# Patient Record
Sex: Female | Born: 1990 | Race: White | Hispanic: No | Marital: Single | Smoking: Never smoker
Health system: Southern US, Community
[De-identification: ages and names within clinical notes are randomized; demographics above are authoritative.]

## PROBLEM LIST (undated history)

## (undated) DIAGNOSIS — J45909 Unspecified asthma, uncomplicated: Secondary | ICD-10-CM

## (undated) DIAGNOSIS — F419 Anxiety disorder, unspecified: Secondary | ICD-10-CM

## (undated) HISTORY — PX: SKIN CANCER EXCISION: SHX779

---
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
## 2012-06-09 ENCOUNTER — Emergency Department (HOSPITAL_COMMUNITY)
Admission: EM | Admit: 2012-06-09 | Discharge: 2012-06-09 | Disposition: A | Discharge status: Home / Self Care | Payer: 59 | Attending: Emergency Medicine | Admitting: Emergency Medicine

## 2012-06-09 ENCOUNTER — Emergency Department (HOSPITAL_COMMUNITY): Payer: 59

## 2012-06-09 ENCOUNTER — Encounter (HOSPITAL_COMMUNITY): Payer: Self-pay

## 2012-06-09 DIAGNOSIS — J45909 Unspecified asthma, uncomplicated: Secondary | ICD-10-CM | POA: Insufficient documentation

## 2012-06-09 DIAGNOSIS — Z79899 Other long term (current) drug therapy: Secondary | ICD-10-CM | POA: Insufficient documentation

## 2012-06-09 DIAGNOSIS — R109 Unspecified abdominal pain: Secondary | ICD-10-CM | POA: Insufficient documentation

## 2012-06-09 DIAGNOSIS — Z791 Long term (current) use of non-steroidal anti-inflammatories (NSAID): Secondary | ICD-10-CM | POA: Insufficient documentation

## 2012-06-09 DIAGNOSIS — F411 Generalized anxiety disorder: Secondary | ICD-10-CM | POA: Insufficient documentation

## 2012-06-09 DIAGNOSIS — Z859 Personal history of malignant neoplasm, unspecified: Secondary | ICD-10-CM | POA: Insufficient documentation

## 2012-06-09 LAB — CBC WITH DIFFERENTIAL/PLATELET
Basophils Absolute: 0 10*3/uL (ref 0.0–0.1)
Basophils Relative: 0 % (ref 0–1)
Eosinophils Absolute: 0.2 10*3/uL (ref 0.0–0.7)
Eosinophils Relative: 3 % (ref 0–5)
HCT: 38.7 % (ref 36.0–46.0)
Hemoglobin: 13.7 g/dL (ref 12.0–15.0)
Lymphocytes Relative: 30 % (ref 12–46)
Lymphs Abs: 2.4 10*3/uL (ref 0.7–4.0)
MCH: 31.1 pg (ref 26.0–34.0)
MCHC: 35.4 g/dL (ref 30.0–36.0)
MCV: 88 fL (ref 78.0–100.0)
Monocytes Absolute: 0.5 10*3/uL (ref 0.1–1.0)
Monocytes Relative: 7 % (ref 3–12)
Neutro Abs: 4.9 10*3/uL (ref 1.7–7.7)
Neutrophils Relative %: 61 % (ref 43–77)
Platelets: 337 10*3/uL (ref 150–400)
RBC: 4.4 MIL/uL (ref 3.87–5.11)
RDW: 12.4 % (ref 11.5–15.5)
WBC: 8.1 10*3/uL (ref 4.0–10.5)

## 2012-06-09 LAB — COMPREHENSIVE METABOLIC PANEL
ALT: 5 U/L (ref 0–35)
AST: 12 U/L (ref 0–37)
Albumin: 3.7 g/dL (ref 3.5–5.2)
Alkaline Phosphatase: 84 U/L (ref 39–117)
BUN: 10 mg/dL (ref 6–23)
CO2: 28 mEq/L (ref 19–32)
Calcium: 9.2 mg/dL (ref 8.4–10.5)
Chloride: 102 mEq/L (ref 96–112)
Creatinine, Ser: 0.81 mg/dL (ref 0.50–1.10)
GFR calc Af Amer: >90 mL/min (ref >90)
GFR calc non Af Amer: >90 mL/min (ref >90)
Glucose, Bld: 101 mg/dL — ABNORMAL HIGH (ref 70–99)
Potassium: 4.2 mEq/L (ref 3.5–5.1)
Sodium: 138 mEq/L (ref 135–145)
Total Bilirubin: 0.6 mg/dL (ref 0.3–1.2)
Total Protein: 6.3 g/dL (ref 6.0–8.3)

## 2012-06-09 LAB — URINALYSIS, ROUTINE W REFLEX MICROSCOPIC
Bilirubin Urine: NEGATIVE
Glucose, UA: NEGATIVE mg/dL
Hgb urine dipstick: NEGATIVE
Leukocytes, UA: NEGATIVE
Nitrite: NEGATIVE
Protein, ur: NEGATIVE mg/dL
Specific Gravity, Urine: 1.027 (ref 1.005–1.030)
Urobilinogen, UA: 1 mg/dL (ref 0.0–1.0)
pH: 5.5 (ref 5.0–8.0)

## 2012-06-09 LAB — POCT PREGNANCY, URINE: Preg Test, Ur: NEGATIVE

## 2012-06-09 MED ORDER — FAMOTIDINE 20 MG PO TABS: 20.0000 mg | ORAL_TABLET | Freq: Two times a day (BID) | ORAL | Status: DC

## 2012-06-09 NOTE — ED Notes (Signed)
Patient c/o right upper abdominal pain, constipation, and nausea x 3 days. Patient reports the pain began 3 months ago and is intermittent. Patient went to [facility] walk-in clinic and was told to come to the ED for further evaluation and treatment.

## 2012-06-09 NOTE — ED Provider Notes (Signed)
History    21 year old female with abdominal pain. Pain is in the epigastric and right upper quadrant. Intermittent for the past several months. Pain radiates sometimes to her back. Onset often shortly after eating. Associated with nausea. No vomiting. No urinary complaints. No fevers or chills. Patient has a past history of asthma, which is otherwise healthy. No history of any abdominal or pelvic surgeries.  CSN: [id number]  Arrival date & time 06/09/12  1020   First MD Initiated Contact with Patient 06/09/12 1116      Chief Complaint  Patient presents with  . Abdominal Pain  . Fever    (Consider location/radiation/quality/duration/timing/severity/associated sxs/prior treatment) HPI  Past Medical History  Diagnosis Date  . Asthma   . Anxiety     Past Surgical History  Procedure Date  . Skin cancer excision     Family History  Problem Relation Age of Onset  . Diabetes Father   . Hyperlipidemia Father   . Hypertension Father   . Asthma Sister     History  Substance Use Topics  . Smoking status: Never Smoker   . Smokeless tobacco: Never Used  . Alcohol Use: No    OB History    Grav Para Term Preterm Abortions TAB SAB Ect Mult Living                  Review of Systems  All systems reviewed and negative, other than as noted in HPI.   Allergies  Review of patient's allergies indicates no known allergies.  Home Medications   Current Outpatient Rx  Name  Route  Sig  Dispense  Refill  . ALBUTEROL SULFATE HFA 108 (90 BASE) MCG/ACT IN AERS   Inhalation   Inhale 2 puffs into the lungs every 6 (six) hours as needed. For shortness of breath.         Marland Kitchen FLUTICASONE-SALMETEROL 100-50 MCG/DOSE IN AEPB   Inhalation   Inhale 1 puff into the lungs every 12 (twelve) hours.         . IBUPROFEN 200 MG PO TABS   Oral   Take 400 mg by mouth every 8 (eight) hours as needed. For pain.         Marland Kitchen NYQUIL PO   Oral   Take 30 mLs by mouth every 6 (six) hours as  needed. For sleep         . TOBRAMYCIN SULFATE 0.3 % OP SOLN   Left Eye   Place 1 drop into the left eye every 8 (eight) hours.         Marland Kitchen FAMOTIDINE 20 MG PO TABS   Oral   Take 1 tablet (20 mg total) by mouth 2 (two) times daily.   60 tablet   0     BP 110/56  Pulse 91  Temp 97.8 F (36.6 C) (Oral)  Resp 20  SpO2 100%  LMP 05/26/2012  Physical Exam  Nursing note and vitals reviewed. Constitutional: She appears well-developed and well-nourished. No distress.       Laying in bed. Nad. Obese.  HENT:  Head: Normocephalic and atraumatic.  Eyes: Conjunctivae normal are normal. Right eye exhibits no discharge. Left eye exhibits no discharge.  Neck: Neck supple.  Cardiovascular: Normal rate, regular rhythm and normal heart sounds.  Exam reveals no gallop and no friction rub.   No murmur heard. Pulmonary/Chest: Effort normal and breath sounds normal. No respiratory distress.  Abdominal: Soft. She exhibits no distension. There is tenderness.  RUQ tenderness w/o rebound or guarding. No distension. No mass palpated.  Musculoskeletal: She exhibits no edema and no tenderness.  Neurological: She is alert.  Skin: Skin is warm and dry. She is not diaphoretic.  Psychiatric: She has a normal mood and affect. Her behavior is normal. Thought content normal.    ED Course  Procedures (including critical care time)  Labs Reviewed  COMPREHENSIVE METABOLIC PANEL - Abnormal; Notable for the following:    Glucose, Bld 101 (*)     All other components within normal limits  URINALYSIS, ROUTINE W REFLEX MICROSCOPIC - Abnormal; Notable for the following:    Color, Urine AMBER (*)  BIOCHEMICALS MAY BE AFFECTED BY COLOR   APPearance CLOUDY (*)     Ketones, ur TRACE (*)     All other components within normal limits  CBC WITH DIFFERENTIAL  POCT PREGNANCY, URINE   US Abdomen Complete  06/09/2012  *RADIOLOGY REPORT*  Clinical Data:  Right upper quadrant abdominal pain.  COMPLETE  ABDOMINAL ULTRASOUND  Comparison:  CT 11/24/2011.  Findings:  Gallbladder:  No gallstones, gallbladder wall thickening, or pericholecystic fluid.  Common bile duct:  4 mm, normal.  Liver:  No focal lesion identified.  Within normal limits in parenchymal echogenicity.  IVC:  Appears normal.  Pancreas:  Suboptimally visualized due to overlying bowel gas.  Spleen:  5.8 cm.  Normal echotexture.  Right Kidney:  11.7 cm. Normal echotexture.  Normal central sinus echo complex.  No calculi or hydronephrosis.  Left Kidney:  10.6 cm. Normal echotexture.  Normal central sinus echo complex.  No calculi or hydronephrosis.  Abdominal aorta:  No aneurysm identified.  IMPRESSION: Negative abdominal ultrasound.   Original Report Authenticated By: Andreas Newport, M.D.      1. Abdominal pain       MDM  21 year old female abdominal pain. Peritoneal. Provided history consistent with biliary colic and subsequent ultrasound negative for a stone. May potentially be biliary dyskinesia. Less likely PUD or reflux. We'll give a trial of H2 blocker though. Recommend following up with PCP to discuss possible GI referral. Emergent return precautions were discussed.        Raeford Razor, MD 06/09/12 [phone]

## 2012-06-09 NOTE — ED Notes (Signed)
[id number]<AV> Expected date:<BR> Expected time:<BR> Means of arrival:<BR> Comments:<BR> Closed

## 2012-06-14 ENCOUNTER — Other Ambulatory Visit (HOSPITAL_COMMUNITY): Payer: Self-pay | Admitting: Family Medicine

## 2012-06-14 DIAGNOSIS — R109 Unspecified abdominal pain: Secondary | ICD-10-CM

## 2012-06-18 ENCOUNTER — Encounter (HOSPITAL_COMMUNITY)
Admission: RE | Admit: 2012-06-18 | Discharge: 2012-06-18 | Disposition: A | Discharge status: Home / Self Care | Payer: 59 | Source: Ambulatory Visit | Attending: Family Medicine | Admitting: Family Medicine

## 2012-06-18 DIAGNOSIS — R1011 Right upper quadrant pain: Secondary | ICD-10-CM | POA: Insufficient documentation

## 2012-06-18 DIAGNOSIS — R109 Unspecified abdominal pain: Secondary | ICD-10-CM

## 2012-06-18 MED ORDER — TECHNETIUM TC 99M MEBROFENIN IV KIT
5.5000 | PACK | Freq: Once | INTRAVENOUS | Status: AC | PRN
  Administered 2012-06-18: 6 via INTRAVENOUS

## 2012-09-19 ENCOUNTER — Other Ambulatory Visit: Payer: Self-pay | Admitting: Family Medicine

## 2012-09-19 DIAGNOSIS — R52 Pain, unspecified: Secondary | ICD-10-CM

## 2012-09-20 ENCOUNTER — Ambulatory Visit
Admission: RE | Admit: 2012-09-20 | Discharge: 2012-09-20 | Disposition: A | Discharge status: Home / Self Care | Payer: 59 | Source: Ambulatory Visit | Attending: Family Medicine | Admitting: Family Medicine

## 2012-09-20 DIAGNOSIS — R52 Pain, unspecified: Secondary | ICD-10-CM

## 2013-02-08 ENCOUNTER — Encounter (HOSPITAL_COMMUNITY): Payer: Self-pay

## 2013-02-08 ENCOUNTER — Emergency Department (HOSPITAL_COMMUNITY)
Admission: EM | Admit: 2013-02-08 | Discharge: 2013-02-08 | Disposition: A | Discharge status: Home / Self Care | Payer: 59 | Attending: Emergency Medicine | Admitting: Emergency Medicine

## 2013-02-08 DIAGNOSIS — Z79899 Other long term (current) drug therapy: Secondary | ICD-10-CM | POA: Insufficient documentation

## 2013-02-08 DIAGNOSIS — Z8659 Personal history of other mental and behavioral disorders: Secondary | ICD-10-CM | POA: Insufficient documentation

## 2013-02-08 DIAGNOSIS — J45909 Unspecified asthma, uncomplicated: Secondary | ICD-10-CM | POA: Insufficient documentation

## 2013-02-08 DIAGNOSIS — R1013 Epigastric pain: Secondary | ICD-10-CM | POA: Insufficient documentation

## 2013-02-08 DIAGNOSIS — Z3202 Encounter for pregnancy test, result negative: Secondary | ICD-10-CM | POA: Insufficient documentation

## 2013-02-08 DIAGNOSIS — R112 Nausea with vomiting, unspecified: Secondary | ICD-10-CM | POA: Insufficient documentation

## 2013-02-08 LAB — COMPREHENSIVE METABOLIC PANEL
ALT: 18 U/L (ref 0–35)
AST: 46 U/L — ABNORMAL HIGH (ref 0–37)
Albumin: 4 g/dL (ref 3.5–5.2)
Alkaline Phosphatase: 78 U/L (ref 39–117)
BUN: 6 mg/dL (ref 6–23)
Chloride: 100 mEq/L (ref 96–112)
Potassium: 3.8 mEq/L (ref 3.5–5.1)
Sodium: 137 mEq/L (ref 135–145)
Total Bilirubin: 0.5 mg/dL (ref 0.3–1.2)

## 2013-02-08 LAB — CBC WITH DIFFERENTIAL/PLATELET
Basophils Relative: 0 % (ref 0–1)
Hemoglobin: 12.6 g/dL (ref 12.0–15.0)
MCHC: 34 g/dL (ref 30.0–36.0)
Monocytes Relative: 6 % (ref 3–12)
Neutro Abs: 6.9 10*3/uL (ref 1.7–7.7)
Neutrophils Relative %: 65 % (ref 43–77)
Platelets: 322 10*3/uL (ref 150–400)
RBC: 4.2 MIL/uL (ref 3.87–5.11)

## 2013-02-08 LAB — RAPID URINE DRUG SCREEN, HOSP PERFORMED
Amphetamines: NOT DETECTED
Barbiturates: NOT DETECTED
Benzodiazepines: NOT DETECTED
Tetrahydrocannabinol: NOT DETECTED

## 2013-02-08 LAB — PREGNANCY, URINE: Preg Test, Ur: NEGATIVE

## 2013-02-08 LAB — ETHANOL: Alcohol, Ethyl (B): <11 mg/dL (ref 0–11)

## 2013-02-08 MED ORDER — GI COCKTAIL ~~LOC~~
30.0000 mL | Freq: Once | ORAL | Status: AC
  Administered 2013-02-08: 30 mL via ORAL
  Filled 2013-02-08: qty 30

## 2013-02-08 MED ORDER — PROMETHAZINE HCL 25 MG PO TABS: 25.0000 mg | ORAL_TABLET | Freq: Four times a day (QID) | ORAL | Status: DC | PRN

## 2013-02-08 MED ORDER — METOCLOPRAMIDE HCL 5 MG/ML IJ SOLN
10.0000 mg | Freq: Once | INTRAMUSCULAR | Status: AC
  Administered 2013-02-08: 10 mg via INTRAVENOUS
  Filled 2013-02-08: qty 2

## 2013-02-08 MED ORDER — ONDANSETRON HCL 4 MG/2ML IJ SOLN
4.0000 mg | Freq: Once | INTRAMUSCULAR | Status: AC
  Administered 2013-02-08: 4 mg via INTRAVENOUS
  Filled 2013-02-08: qty 2

## 2013-02-08 MED ORDER — SODIUM CHLORIDE 0.9 % IV BOLUS (SEPSIS)
1000.0000 mL | Freq: Once | INTRAVENOUS | Status: AC
  Administered 2013-02-08: 1000 mL via INTRAVENOUS

## 2013-02-08 MED ORDER — PANTOPRAZOLE SODIUM 40 MG IV SOLR: 40.0000 mg | Freq: Once | INTRAVENOUS | Status: DC

## 2013-02-08 NOTE — ED Provider Notes (Signed)
CSN: [id number]     Arrival date & time 02/08/13  1754 History     First MD Initiated Contact with Patient 02/08/13 1802     Chief Complaint  Patient presents with  . Hematemesis   (Consider location/radiation/quality/duration/timing/severity/associated sxs/prior Treatment) HPI  Norma Cuevas is a 22 y.o.female with a significant PMH of asthma and anxiety presents to the ER with complaints of hematemesis that started today. She had one episode of vomiting yesterday. Today she had 2 episodes of normal vomiting and then afterwards had two episodes of what she describes as frank bloody vomitus. She described it has being a half cup. Then her third vomit was dark vomit. She is also complaining of epigastric pain. She denies having a history of abdominal pain. Nor a history of GERD, abdominal surgery, alcohol abuse, hx of vomiting blood. She does not appear to be in distress or pain at this time. No vomiting during my interview.   Past Medical History  Diagnosis Date  . Asthma   . Anxiety    Past Surgical History  Procedure Laterality Date  . Skin cancer excision     Family History  Problem Relation Age of Onset  . Diabetes Father   . Hyperlipidemia Father   . Hypertension Father   . Asthma Sister    History  Substance Use Topics  . Smoking status: Never Smoker   . Smokeless tobacco: Never Used  . Alcohol Use: No   OB History   Grav Para Term Preterm Abortions TAB SAB Ect Mult Living                 Review of Systems ROS is negative unless otherwise stated in the HPI  Allergies  Review of patient's allergies indicates no known allergies.  Home Medications   Current Outpatient Rx  Name  Route  Sig  Dispense  Refill  . albuterol (PROVENTIL HFA;VENTOLIN HFA) 108 (90 BASE) MCG/ACT inhaler   Inhalation   Inhale 2 puffs into the lungs every 6 (six) hours as needed. For shortness of breath.         . ARIPiprazole (ABILIFY) 5 MG tablet   Oral   Take 5 mg by mouth  daily.         . Fluticasone-Salmeterol (ADVAIR) 100-50 MCG/DOSE AEPB   Inhalation   Inhale 1 puff into the lungs every 12 (twelve) hours.          BP 143/85  Pulse 74  Temp(Src) 98.8 F (37.1 C) (Oral)  Resp 16  SpO2 100%  LMP 12/04/2012 Physical Exam  Nursing note and vitals reviewed. Constitutional: She appears well-developed and well-nourished. No distress.  HENT:  Head: Normocephalic and atraumatic.   HEENT: Anicteric.  No pallor.  No discharge from ears, eyes, nose, or mouth.   Eyes: Pupils are equal, round, and reactive to light.  Neck: Normal range of motion. Neck supple.  Cardiovascular: Normal rate and regular rhythm.   Pulmonary/Chest: Effort normal.  Abdominal: Soft. There is tenderness in the epigastric area. There is no rigidity, no rebound, no guarding, no CVA tenderness, no tenderness at McBurney's point and negative Murphy's sign.    Neurological: She is alert.  Skin: Skin is warm and dry.    ED Course   Procedures (including critical care time)  Labs Reviewed  CBC WITH DIFFERENTIAL - Abnormal; Notable for the following:    WBC 10.7 (*)    All other components within normal limits  COMPREHENSIVE METABOLIC PANEL - Abnormal;  Notable for the following:    AST 46 (*)    All other components within normal limits  ETHANOL  URINE RAPID DRUG SCREEN (HOSP PERFORMED)  OCCULT BLOOD GASTRIC / DUODENUM (SPECIMEN CUP)  PREGNANCY, URINE   No results found. 1. Nausea and vomiting in adult     MDM  Dr. Romeo Apple has seen patient as well. Throughout patients stay she had no episodes of vomiting so far after 3 hours. Her labs show a slightly elevated white blood cell count but not other abnormalities.  I gave her GI cocktail and Reglan for pain and nausea symptoms which significantly helped her nausea and discomfort. No clinical or laboratory signs of dehydration but given 1 L NS.  Patient feeling much better. Will give referral to GI and nausea medications  for home. Pt comfortable with plan.  22 y.o.Norma Cuevas's evaluation in the Emergency Department is complete. It has been determined that no acute conditions requiring further emergency intervention are present at this time. The patient/guardian have been advised of the diagnosis and plan. We have discussed signs and symptoms that warrant return to the ED, such as changes or worsening in symptoms.  Vital signs are stable at discharge. Filed Vitals:   02/08/13 1809  BP: 143/85  Pulse: 74  Temp: 98.8 F (37.1 C)  Resp: 16    Patient/guardian has voiced understanding and agreed to follow-up with the PCP or specialist.   Dorthula Matas, PA-C 02/08/13 2108

## 2013-02-08 NOTE — ED Notes (Signed)
She states she had a few diarrhea stools yesterday, but felt well.  No diarrhea today, but she is here with c/o n/v several times past 3-4 hours, and in the last couple of emeses she has seen a quantity of "dark blood".  She is healthy-looking and in no distress.

## 2013-02-08 NOTE — ED Provider Notes (Signed)
Medical screening examination/treatment/procedure(s) were performed by non-physician practitioner and as supervising physician I was immediately available for consultation/collaboration.   Junius Argyle, MD 02/08/13 [phone]

## 2013-08-18 ENCOUNTER — Emergency Department (HOSPITAL_COMMUNITY)
Admission: EM | Admit: 2013-08-18 | Discharge: 2013-08-19 | Disposition: A | Discharge status: Home / Self Care | Payer: 59 | Attending: Emergency Medicine | Admitting: Emergency Medicine

## 2013-08-18 ENCOUNTER — Encounter (HOSPITAL_COMMUNITY): Payer: Self-pay | Admitting: Emergency Medicine

## 2013-08-18 ENCOUNTER — Emergency Department (HOSPITAL_COMMUNITY): Payer: 59

## 2013-08-18 DIAGNOSIS — Z87448 Personal history of other diseases of urinary system: Secondary | ICD-10-CM | POA: Insufficient documentation

## 2013-08-18 DIAGNOSIS — R51 Headache: Secondary | ICD-10-CM | POA: Insufficient documentation

## 2013-08-18 DIAGNOSIS — R1013 Epigastric pain: Secondary | ICD-10-CM | POA: Insufficient documentation

## 2013-08-18 DIAGNOSIS — R42 Dizziness and giddiness: Secondary | ICD-10-CM | POA: Insufficient documentation

## 2013-08-18 DIAGNOSIS — Z8659 Personal history of other mental and behavioral disorders: Secondary | ICD-10-CM | POA: Insufficient documentation

## 2013-08-18 DIAGNOSIS — Z3202 Encounter for pregnancy test, result negative: Secondary | ICD-10-CM | POA: Insufficient documentation

## 2013-08-18 DIAGNOSIS — R112 Nausea with vomiting, unspecified: Secondary | ICD-10-CM | POA: Insufficient documentation

## 2013-08-18 DIAGNOSIS — J45909 Unspecified asthma, uncomplicated: Secondary | ICD-10-CM | POA: Insufficient documentation

## 2013-08-18 DIAGNOSIS — R300 Dysuria: Secondary | ICD-10-CM | POA: Insufficient documentation

## 2013-08-18 DIAGNOSIS — R3589 Other polyuria: Secondary | ICD-10-CM | POA: Insufficient documentation

## 2013-08-18 DIAGNOSIS — R1031 Right lower quadrant pain: Secondary | ICD-10-CM | POA: Insufficient documentation

## 2013-08-18 DIAGNOSIS — R1030 Lower abdominal pain, unspecified: Secondary | ICD-10-CM

## 2013-08-18 DIAGNOSIS — R358 Other polyuria: Secondary | ICD-10-CM | POA: Insufficient documentation

## 2013-08-18 DIAGNOSIS — Z79899 Other long term (current) drug therapy: Secondary | ICD-10-CM | POA: Insufficient documentation

## 2013-08-18 LAB — COMPREHENSIVE METABOLIC PANEL
ALT: 8 U/L (ref 0–35)
AST: 13 U/L (ref 0–37)
Albumin: 3.8 g/dL (ref 3.5–5.2)
Alkaline Phosphatase: 86 U/L (ref 39–117)
BUN: 10 mg/dL (ref 6–23)
CO2: 27 mEq/L (ref 19–32)
Calcium: 9.1 mg/dL (ref 8.4–10.5)
Chloride: 98 mEq/L (ref 96–112)
Creatinine, Ser: 0.86 mg/dL (ref 0.50–1.10)
GFR calc Af Amer: >90 mL/min (ref >90)
GFR calc non Af Amer: >90 mL/min (ref >90)
Glucose, Bld: 91 mg/dL (ref 70–99)
Potassium: 4.1 mEq/L (ref 3.7–5.3)
Sodium: 136 mEq/L — ABNORMAL LOW (ref 137–147)
Total Bilirubin: 0.3 mg/dL (ref 0.3–1.2)
Total Protein: 6.8 g/dL (ref 6.0–8.3)

## 2013-08-18 LAB — CBC WITH DIFFERENTIAL/PLATELET
Basophils Absolute: 0 10*3/uL (ref 0.0–0.1)
Basophils Relative: 0 % (ref 0–1)
Eosinophils Absolute: 0.2 10*3/uL (ref 0.0–0.7)
Eosinophils Relative: 2 % (ref 0–5)
HCT: 37.9 % (ref 36.0–46.0)
Hemoglobin: 13.4 g/dL (ref 12.0–15.0)
Lymphocytes Relative: 28 % (ref 12–46)
Lymphs Abs: 3.1 10*3/uL (ref 0.7–4.0)
MCH: 31.3 pg (ref 26.0–34.0)
MCHC: 35.4 g/dL (ref 30.0–36.0)
MCV: 88.6 fL (ref 78.0–100.0)
Monocytes Absolute: 0.5 10*3/uL (ref 0.1–1.0)
Monocytes Relative: 4 % (ref 3–12)
Neutro Abs: 7.1 10*3/uL (ref 1.7–7.7)
Neutrophils Relative %: 66 % (ref 43–77)
Platelets: 358 10*3/uL (ref 150–400)
RBC: 4.28 MIL/uL (ref 3.87–5.11)
RDW: 12.3 % (ref 11.5–15.5)
WBC: 10.9 10*3/uL — ABNORMAL HIGH (ref 4.0–10.5)

## 2013-08-18 LAB — WET PREP, GENITAL
CLUE CELLS WET PREP: NONE SEEN
Trich, Wet Prep: NONE SEEN
Yeast Wet Prep HPF POC: NONE SEEN

## 2013-08-18 LAB — URINALYSIS, ROUTINE W REFLEX MICROSCOPIC
Bilirubin Urine: NEGATIVE
Glucose, UA: NEGATIVE mg/dL
Hgb urine dipstick: NEGATIVE
Ketones, ur: NEGATIVE mg/dL
Leukocytes, UA: NEGATIVE
Nitrite: NEGATIVE
Protein, ur: NEGATIVE mg/dL
Specific Gravity, Urine: 1.023 (ref 1.005–1.030)
Urobilinogen, UA: 0.2 mg/dL (ref 0.0–1.0)
pH: 5.5 (ref 5.0–8.0)

## 2013-08-18 LAB — POC URINE PREG, ED: Preg Test, Ur: NEGATIVE

## 2013-08-18 LAB — LIPASE, BLOOD: Lipase: 15 U/L (ref 11–59)

## 2013-08-18 MED ORDER — IOHEXOL 300 MG/ML  SOLN
100.0000 mL | Freq: Once | INTRAMUSCULAR | Status: AC | PRN
  Administered 2013-08-18: 100 mL via INTRAVENOUS

## 2013-08-18 MED ORDER — ONDANSETRON HCL 4 MG/2ML IJ SOLN
4.0000 mg | Freq: Once | INTRAMUSCULAR | Status: AC
  Administered 2013-08-18: 4 mg via INTRAVENOUS
  Filled 2013-08-18: qty 2

## 2013-08-18 MED ORDER — MORPHINE SULFATE 4 MG/ML IJ SOLN
4.0000 mg | Freq: Once | INTRAMUSCULAR | Status: AC
  Administered 2013-08-18: 4 mg via INTRAVENOUS
  Filled 2013-08-18: qty 1

## 2013-08-18 MED ORDER — IOHEXOL 300 MG/ML  SOLN
50.0000 mL | Freq: Once | INTRAMUSCULAR | Status: AC | PRN
  Administered 2013-08-18: 50 mL via ORAL

## 2013-08-18 NOTE — ED Provider Notes (Signed)
CSN: [id number]     Arrival date & time 08/18/13  1830 History   First MD Initiated Contact with Patient 08/18/13 2027     Chief Complaint  Patient presents with  . Flank Pain  . Abdominal Pain     (Consider location/radiation/quality/duration/timing/severity/associated sxs/prior Treatment) HPI Patient is a 23 year old morbidly obese female who presents complaining of bilateral flank pain x 1 week and new onset epigastric pain that began today. The pain on her right flank is worse than on her left and radiates toward her RLQ. Patient states the pain began one week ago and she was treated with ciprofloxacin at an Urgent Care for a bladder infection. She returned to the Urgent Care this morning because the pain had not gone away. Patient states the Urgent Care did not know what was causing the pain which got worse gradually throughout the day so she came here. The patient also notes sharp, intermittent epigastric pain that started today. She has tried ibuprofen to manage the pain but is without relief. LMP was one week ago, before the onset of symptoms. Patient says there is no chance she is pregnant. She has no prior history of abdominal surgery.  Past Medical History  Diagnosis Date  . Asthma   . Anxiety    Past Surgical History  Procedure Laterality Date  . Skin cancer excision     Family History  Problem Relation Age of Onset  . Diabetes Father   . Hyperlipidemia Father   . Hypertension Father   . Asthma Sister    History  Substance Use Topics  . Smoking status: Never Smoker   . Smokeless tobacco: Never Used  . Alcohol Use: No   OB History   Grav Para Term Preterm Abortions TAB SAB Ect Mult Living                 Review of Systems  Constitutional: Negative for fever and chills.  Eyes: Negative.   Respiratory: Negative.  Negative for cough and shortness of breath.   Cardiovascular: Negative for chest pain and palpitations.  Gastrointestinal: Positive for nausea, vomiting  and abdominal pain. Negative for diarrhea, constipation and blood in stool.  Endocrine: Positive for polyuria.  Genitourinary: Positive for dysuria and flank pain. Negative for vaginal discharge.  Musculoskeletal: Negative.   Skin: Negative.   Allergic/Immunologic: Negative.   Neurological: Positive for light-headedness and headaches.  Hematological: Negative.   Psychiatric/Behavioral: Negative.    Patient endorses bilateral band-like headache, lightheadedness, nausea and vomiting. She says she saw bright red blood in her vomit once. Patient has had polyuria and dysuria but has not noticed blood in her urine. Patient denies fever, chills, cough, shortness of breath, current chest pain, palpitations, diarrhea, constipation, change in bowel habits, or irregular vaginal discharge. LMP was one week ago.    Allergies  Review of patient's allergies indicates no known allergies.  Home Medications   Current Outpatient Rx  Name  Route  Sig  Dispense  Refill  . ARIPiprazole 400 MG SUSR   Intramuscular   Inject 400 mg into the muscle once.         . Meth-Hyo-M Bl-Na Phos-Ph Sal (URIBEL PO)   Oral   Take 1 tablet by mouth 4 (four) times daily.         . ValACYclovir HCl (VALTREX PO)   Oral   Take 1 tablet by mouth 2 (two) times daily. For flare-up.         Marland Kitchen. albuterol (  PROVENTIL HFA;VENTOLIN HFA) 108 (90 BASE) MCG/ACT inhaler   Inhalation   Inhale 2 puffs into the lungs every 6 (six) hours as needed. For shortness of breath.         . ciprofloxacin (CIPRO) 500 MG tablet   Oral   Take 500 mg by mouth 2 (two) times daily. For 5 days.          BP 130/67  Pulse 82  Temp(Src) 97.8 F (36.6 C) (Oral)  Resp 16  SpO2 100%  LMP 08/08/2013 Physical Exam  Constitutional: Vital signs are normal. She appears well-developed and well-nourished. She is cooperative. No distress.  Cardiovascular: Normal rate and normal heart sounds.  Exam reveals no gallop and no friction rub.   No  murmur heard. Pulmonary/Chest: Effort normal and breath sounds normal. No respiratory distress. She has no wheezes. She has no rales. She exhibits no tenderness.  Abdominal: Soft. Bowel sounds are normal. There is tenderness in the right lower quadrant and epigastric area. There is guarding, CVA tenderness and tenderness at McBurney's point. Hernia confirmed negative in the right inguinal area and confirmed negative in the left inguinal area.  Genitourinary: Uterus normal. Pelvic exam was performed with patient supine. There is no rash or tenderness on the right labia. There is no rash or tenderness on the left labia. Cervix exhibits no motion tenderness, no discharge and no friability. Right adnexum displays no tenderness. Left adnexum displays no tenderness. No tenderness or bleeding around the vagina. No foreign body around the vagina. No vaginal discharge found.  Lymphadenopathy:       Right: No inguinal adenopathy present.       Left: No inguinal adenopathy present.  Neurological: She is alert.  Skin: Skin is warm and dry. No rash noted. No erythema.    Upon physical exam, patient is in no apparent distress with stable vital signs. No gallops, murmurs, or rubs. Lungs clear to auscultation bilaterally both anteriorly and posteriorly. Right-sided CVA tenderness, mild left-sided CVA tenderness. Movement exacerbates the pain. Postive bowel sounds throughout. Patient is diffusely tender to palpation with positive McBurney's point tenderness and epigastric tenderness. Positive obturator and psoas signs.     ED Course  Procedures (including critical care time)  10:14 PM Patient  presents with right lower quadrant abdominal pain ongoing for a week. She was initially treated for urinary tract infection with Cipro but states it provided no relief. She developed worsening pain which he was seen at urgent care today for the same but was discharge with pain medication only. She is here for further  evaluation. Today her urine shows no evidence of urinary tract infection. Pelvic exam without any significant discomfort. We'll obtain abdominal and pelvis CT scan to rule out appendicitis or colitis.  Pain medication given.    12:15 AM Abdominal and pelvis CT scan shows no acute finding. Her examination, patient has a fairly benign abdomen. She is afebrile with stable normal vital signs. Patient is stable for discharge with close followup with the PCP. Return precautions discussed  labs Review Labs Reviewed  WET PREP, GENITAL - Abnormal; Notable for the following:    WBC, Wet Prep HPF POC RARE (*)    All other components within normal limits  CBC WITH DIFFERENTIAL - Abnormal; Notable for the following:    WBC 10.9 (*)    All other components within normal limits  COMPREHENSIVE METABOLIC PANEL - Abnormal; Notable for the following:    Sodium 136 (*)    All other  components within normal limits  URINALYSIS, ROUTINE W REFLEX MICROSCOPIC - Abnormal; Notable for the following:    Color, Urine GREEN (*)    All other components within normal limits  GC/CHLAMYDIA PROBE AMP  LIPASE, BLOOD  HIV ANTIBODY (ROUTINE TESTING)  POC URINE PREG, ED   Imaging Review Ct Abdomen Pelvis W Contrast  08/18/2013   CLINICAL DATA:  Flank pain.  Rule out appendicitis.  EXAM: CT ABDOMEN AND PELVIS WITH CONTRAST  TECHNIQUE: Multidetector CT imaging of the abdomen and pelvis was performed using the standard protocol following bolus administration of intravenous contrast.  CONTRAST:  OMNIPAQUE IOHEXOL 300 MG/ML  SOLN  COMPARISON:  11/24/2011  FINDINGS: BODY WALL: Unremarkable.  LOWER CHEST: Few locules of gas in the right ventricle.  ABDOMEN/PELVIS:  Liver: No focal abnormality.  Biliary: No evidence of biliary obstruction or stone.  Pancreas: Unremarkable.  Spleen: Unremarkable.  Adrenals: Unremarkable.  Kidneys and ureters: No hydronephrosis or stone.  Bladder: Unremarkable.  Reproductive: Unremarkable.  Bowel: No  obstruction. Normal appendix.  Retroperitoneum: No mass or adenopathy.  Peritoneum: No free fluid or gas.  Vascular: Mild right common iliac artery atherosclerotic calcification.  OSSEOUS: Disc herniations at L3-4, L4-5, and L5-S1, which appear unchanged from 2013 comparison. The largest is right paracentral at L5-S1, contacting the S1 nerve root within the lateral recess.  IMPRESSION: 1. No acute intra-abdominal abnormality, including appendicitis. 2. Lower lumbar degenerative disc disease, without appreciable change from 2013.   Electronically Signed   By: Tiburcio Pea M.D.   On: 08/18/2013 23:58     EKG Interpretation None      MDM   Final diagnoses:  Abdominal pain, lower    BP 130/67  Pulse 82  Temp(Src) 97.8 F (36.6 C) (Oral)  Resp 16  SpO2 100%  LMP 08/08/2013  I have reviewed nursing notes and vital signs. I personally reviewed the imaging tests through PACS system  I reviewed available ER/hospitalization records thought the EMR     Fayrene Helper, [place] 08/19/13 0016

## 2013-08-18 NOTE — ED Notes (Signed)
Per Pt report: pt c/o bilateral flank pain but with the pain on the right side radiating to the from of her lower abd.  Pt also c/o of sharp pain in her epigastric area.  Pt reports pain began about a week ago and it has progressively became worse.  Pt seen at urgent care at last Wednesday and also this morning.  Pt was treated for a bladder infection last week.  Pt prescribed norco today but has not had a chance to get it filled.  Pt denies N/V, SOB, fever or chills. Pt reports pain and frequency upon voiding.  Pt a/o x 4. Skin warm and dry.  Ambulatory in triage.

## 2013-08-19 LAB — HIV ANTIBODY (ROUTINE TESTING W REFLEX): HIV: NONREACTIVE

## 2013-08-19 LAB — GC/CHLAMYDIA PROBE AMP
CT PROBE, AMP APTIMA: NEGATIVE
GC Probe RNA: NEGATIVE

## 2013-08-19 NOTE — ED Provider Notes (Signed)
Medical screening examination/treatment/procedure(s) were performed by non-physician practitioner and as supervising physician I was immediately available for consultation/collaboration.   EKG Interpretation None       Fia Hebert, MD 08/19/13 1458 

## 2013-08-19 NOTE — Discharge Instructions (Signed)
Abdominal Pain, Women °Abdominal (stomach, pelvic, or belly) pain can be caused by many things. It is important to tell your doctor: °· The location of the pain. °· Does it come and go or is it present all the time? °· Are there things that start the pain (eating certain foods, exercise)? °· Are there other symptoms associated with the pain (fever, nausea, vomiting, diarrhea)? °All of this is helpful to know when trying to find the cause of the pain. °CAUSES  °· Stomach: virus or bacteria infection, or ulcer. °· Intestine: appendicitis (inflamed appendix), regional ileitis (Crohn's disease), ulcerative colitis (inflamed colon), irritable bowel syndrome, diverticulitis (inflamed diverticulum of the colon), or cancer of the stomach or intestine. °· Gallbladder disease or stones in the gallbladder. °· Kidney disease, kidney stones, or infection. °· Pancreas infection or cancer. °· Fibromyalgia (pain disorder). °· Diseases of the female organs: °· Uterus: fibroid (non-cancerous) tumors or infection. °· Fallopian tubes: infection or tubal pregnancy. °· Ovary: cysts or tumors. °· Pelvic adhesions (scar tissue). °· Endometriosis (uterus lining tissue growing in the pelvis and on the pelvic organs). °· Pelvic congestion syndrome (female organs filling up with blood just before the menstrual period). °· Pain with the menstrual period. °· Pain with ovulation (producing an egg). °· Pain with an IUD (intrauterine device, birth control) in the uterus. °· Cancer of the female organs. °· Functional pain (pain not caused by a disease, may improve without treatment). °· Psychological pain. °· Depression. °DIAGNOSIS  °Your doctor will decide the seriousness of your pain by doing an examination. °· Blood tests. °· X-rays. °· Ultrasound. °· CT scan (computed tomography, special type of X-ray). °· MRI (magnetic resonance imaging). °· Cultures, for infection. °· Barium enema (dye inserted in the large intestine, to better view it with  X-rays). °· Colonoscopy (looking in intestine with a lighted tube). °· Laparoscopy (minor surgery, looking in abdomen with a lighted tube). °· Major abdominal exploratory surgery (looking in abdomen with a large incision). °TREATMENT  °The treatment will depend on the cause of the pain.  °· Many cases can be observed and treated at home. °· Over-the-counter medicines recommended by your caregiver. °· Prescription medicine. °· Antibiotics, for infection. °· Birth control pills, for painful periods or for ovulation pain. °· Hormone treatment, for endometriosis. °· Nerve blocking injections. °· Physical therapy. °· Antidepressants. °· Counseling with a psychologist or psychiatrist. °· Minor or major surgery. °HOME CARE INSTRUCTIONS  °· Do not take laxatives, unless directed by your caregiver. °· Take over-the-counter pain medicine only if ordered by your caregiver. Do not take aspirin because it can cause an upset stomach or bleeding. °· Try a clear liquid diet (broth or water) as ordered by your caregiver. Slowly move to a bland diet, as tolerated, if the pain is related to the stomach or intestine. °· Have a thermometer and take your temperature several times a day, and record it. °· Bed rest and sleep, if it helps the pain. °· Avoid sexual intercourse, if it causes pain. °· Avoid stressful situations. °· Keep your follow-up appointments and tests, as your caregiver orders. °· If the pain does not go away with medicine or surgery, you may try: °· Acupuncture. °· Relaxation exercises (yoga, meditation). °· Group therapy. °· Counseling. °SEEK MEDICAL CARE IF:  °· You notice certain foods cause stomach pain. °· Your home care treatment is not helping your pain. °· You need stronger pain medicine. °· You want your IUD removed. °· You feel faint or   lightheaded. °· You develop nausea and vomiting. °· You develop a rash. °· You are having side effects or an allergy to your medicine. °SEEK IMMEDIATE MEDICAL CARE IF:  °· Your  pain does not go away or gets worse. °· You have a fever. °· Your pain is felt only in portions of the abdomen. The right side could possibly be appendicitis. The left lower portion of the abdomen could be colitis or diverticulitis. °· You are passing blood in your stools (bright red or black tarry stools, with or without vomiting). °· You have blood in your urine. °· You develop chills, with or without a fever. °· You pass out. °MAKE SURE YOU:  °· Understand these instructions. °· Will watch your condition. °· Will get help right away if you are not doing well or get worse. °Document Released: 04/02/2007 Document Revised: 08/28/2011 Document Reviewed: 04/22/2009 °[employer]® Patient Information ©2014 [employer]. ° °

## 2013-10-10 ENCOUNTER — Encounter (HOSPITAL_COMMUNITY): Payer: Self-pay | Admitting: Emergency Medicine

## 2013-10-10 ENCOUNTER — Emergency Department (HOSPITAL_COMMUNITY)
Admission: EM | Admit: 2013-10-10 | Discharge: 2013-10-10 | Disposition: A | Discharge status: Home / Self Care | Payer: 59 | Attending: Emergency Medicine | Admitting: Emergency Medicine

## 2013-10-10 ENCOUNTER — Emergency Department (HOSPITAL_COMMUNITY): Payer: 59

## 2013-10-10 DIAGNOSIS — F411 Generalized anxiety disorder: Secondary | ICD-10-CM | POA: Insufficient documentation

## 2013-10-10 DIAGNOSIS — Z792 Long term (current) use of antibiotics: Secondary | ICD-10-CM | POA: Insufficient documentation

## 2013-10-10 DIAGNOSIS — Z79899 Other long term (current) drug therapy: Secondary | ICD-10-CM | POA: Insufficient documentation

## 2013-10-10 DIAGNOSIS — R519 Headache, unspecified: Secondary | ICD-10-CM

## 2013-10-10 DIAGNOSIS — J3489 Other specified disorders of nose and nasal sinuses: Secondary | ICD-10-CM | POA: Insufficient documentation

## 2013-10-10 DIAGNOSIS — Z7982 Long term (current) use of aspirin: Secondary | ICD-10-CM | POA: Insufficient documentation

## 2013-10-10 DIAGNOSIS — R51 Headache: Secondary | ICD-10-CM | POA: Insufficient documentation

## 2013-10-10 DIAGNOSIS — J45909 Unspecified asthma, uncomplicated: Secondary | ICD-10-CM | POA: Insufficient documentation

## 2013-10-10 DIAGNOSIS — R040 Epistaxis: Secondary | ICD-10-CM

## 2013-10-10 DIAGNOSIS — R1110 Vomiting, unspecified: Secondary | ICD-10-CM | POA: Insufficient documentation

## 2013-10-10 MED ORDER — TRAMADOL HCL 50 MG PO TABS: 50.0000 mg | ORAL_TABLET | Freq: Four times a day (QID) | ORAL | Status: AC | PRN

## 2013-10-10 MED ORDER — DIPHENHYDRAMINE HCL 50 MG/ML IJ SOLN
25.0000 mg | Freq: Once | INTRAMUSCULAR | Status: AC
  Administered 2013-10-10: 25 mg via INTRAMUSCULAR
  Filled 2013-10-10: qty 1

## 2013-10-10 MED ORDER — PROCHLORPERAZINE EDISYLATE 5 MG/ML IJ SOLN
10.0000 mg | Freq: Once | INTRAMUSCULAR | Status: AC
  Administered 2013-10-10: 10 mg via INTRAMUSCULAR
  Filled 2013-10-10: qty 2

## 2013-10-10 MED ORDER — KETOROLAC TROMETHAMINE 60 MG/2ML IM SOLN
60.0000 mg | Freq: Once | INTRAMUSCULAR | Status: AC
  Administered 2013-10-10: 60 mg via INTRAMUSCULAR
  Filled 2013-10-10: qty 2

## 2013-10-10 MED ORDER — DEXAMETHASONE SODIUM PHOSPHATE 10 MG/ML IJ SOLN
10.0000 mg | Freq: Once | INTRAMUSCULAR | Status: AC
  Administered 2013-10-10: 10 mg via INTRAMUSCULAR
  Filled 2013-10-10: qty 1

## 2013-10-10 NOTE — ED Notes (Signed)
Patient transported to CT 

## 2013-10-10 NOTE — Discharge Instructions (Signed)
Headaches, Frequently Asked Questions °MIGRAINE HEADACHES °Q: What is migraine? What causes it? How can I treat it? °A: Generally, migraine headaches begin as a dull ache. Then they develop into a constant, throbbing, and pulsating pain. You may experience pain at the temples. You may experience pain at the front or back of one or both sides of the head. The pain is usually accompanied by a combination of: °· Nausea. °· Vomiting. °· Sensitivity to light and noise. °Some people (about 15%) experience an aura (see below) before an attack. The cause of migraine is believed to be chemical reactions in the brain. Treatment for migraine may include over-the-counter or prescription medications. It may also include self-help techniques. These include relaxation training and biofeedback.  °Q: What is an aura? °A: About 15% of people with migraine get an "aura". This is a sign of neurological symptoms that occur before a migraine headache. You may see wavy or jagged lines, dots, or flashing lights. You might experience tunnel vision or blind spots in one or both eyes. The aura can include visual or auditory hallucinations (something imagined). It may include disruptions in smell (such as strange odors), taste or touch. Other symptoms include: °· Numbness. °· A "pins and needles" sensation. °· Difficulty in recalling or speaking the correct word. °These neurological events may last as long as 60 minutes. These symptoms will fade as the headache begins. °Q: What is a trigger? °A: Certain physical or environmental factors can lead to or "trigger" a migraine. These include: °· Foods. °· Hormonal changes. °· Weather. °· Stress. °It is important to remember that triggers are different for everyone. To help prevent migraine attacks, you need to figure out which triggers affect you. Keep a headache diary. This is a good way to track triggers. The diary will help you talk to your healthcare professional about your condition. °Q: Does  weather affect migraines? °A: Bright sunshine, hot, humid conditions, and drastic changes in barometric pressure may lead to, or "trigger," a migraine attack in some people. But studies have shown that weather does not act as a trigger for everyone with migraines. °Q: What is the link between migraine and hormones? °A: Hormones start and regulate many of your body's functions. Hormones keep your body in balance within a constantly changing environment. The levels of hormones in your body are unbalanced at times. Examples are during menstruation, pregnancy, or menopause. That can lead to a migraine attack. In fact, about three quarters of all women with migraine report that their attacks are related to the menstrual cycle.  °Q: Is there an increased risk of stroke for migraine sufferers? °A: The likelihood of a migraine attack causing a stroke is very remote. That is not to say that migraine sufferers cannot have a stroke associated with their migraines. In persons under age 40, the most common associated factor for stroke is migraine headache. But over the course of a person's normal life span, the occurrence of migraine headache may actually be associated with a reduced risk of dying from cerebrovascular disease due to stroke.  °Q: What are acute medications for migraine? °A: Acute medications are used to treat the pain of the headache after it has started. Examples over-the-counter medications, NSAIDs, ergots, and triptans.  °Q: What are the triptans? °A: Triptans are the newest class of abortive medications. They are specifically targeted to treat migraine. Triptans are vasoconstrictors. They moderate some chemical reactions in the brain. The triptans work on receptors in your brain. Triptans help   to restore the balance of a neurotransmitter called serotonin. Fluctuations in levels of serotonin are thought to be a main cause of migraine.  °Q: Are over-the-counter medications for migraine effective? °A:  Over-the-counter, or "OTC," medications may be effective in relieving mild to moderate pain and associated symptoms of migraine. But you should see your caregiver before beginning any treatment regimen for migraine.  °Q: What are preventive medications for migraine? °A: Preventive medications for migraine are sometimes referred to as "prophylactic" treatments. They are used to reduce the frequency, severity, and length of migraine attacks. Examples of preventive medications include antiepileptic medications, antidepressants, beta-blockers, calcium channel blockers, and NSAIDs (nonsteroidal anti-inflammatory drugs). °Q: Why are anticonvulsants used to treat migraine? °A: During the past few years, there has been an increased interest in antiepileptic drugs for the prevention of migraine. They are sometimes referred to as "anticonvulsants". Both epilepsy and migraine may be caused by similar reactions in the brain.  °Q: Why are antidepressants used to treat migraine? °A: Antidepressants are typically used to treat people with depression. They may reduce migraine frequency by regulating chemical levels, such as serotonin, in the brain.  °Q: What alternative therapies are used to treat migraine? °A: The term "alternative therapies" is often used to describe treatments considered outside the scope of conventional Western medicine. Examples of alternative therapy include acupuncture, acupressure, and yoga. Another common alternative treatment is herbal therapy. Some herbs are believed to relieve headache pain. Always discuss alternative therapies with your caregiver before proceeding. Some herbal products contain arsenic and other toxins. °TENSION HEADACHES °Q: What is a tension-type headache? What causes it? How can I treat it? °A: Tension-type headaches occur randomly. They are often the result of temporary stress, anxiety, fatigue, or anger. Symptoms include soreness in your temples, a tightening band-like sensation  around your head (a "vice-like" ache). Symptoms can also include a pulling feeling, pressure sensations, and contracting head and neck muscles. The headache begins in your forehead, temples, or the back of your head and neck. Treatment for tension-type headache may include over-the-counter or prescription medications. Treatment may also include self-help techniques such as relaxation training and biofeedback. °CLUSTER HEADACHES °Q: What is a cluster headache? What causes it? How can I treat it? °A: Cluster headache gets its name because the attacks come in groups. The pain arrives with little, if any, warning. It is usually on one side of the head. A tearing or bloodshot eye and a runny nose on the same side of the headache may also accompany the pain. Cluster headaches are believed to be caused by chemical reactions in the brain. They have been described as the most severe and intense of any headache type. Treatment for cluster headache includes prescription medication and oxygen. °SINUS HEADACHES °Q: What is a sinus headache? What causes it? How can I treat it? °A: When a cavity in the bones of the face and skull (a sinus) becomes inflamed, the inflammation will cause localized pain. This condition is usually the result of an allergic reaction, a tumor, or an infection. If your headache is caused by a sinus blockage, such as an infection, you will probably have a fever. An x-ray will confirm a sinus blockage. Your caregiver's treatment might include antibiotics for the infection, as well as antihistamines or decongestants.  °REBOUND HEADACHES °Q: What is a rebound headache? What causes it? How can I treat it? °A: A pattern of taking acute headache medications too often can lead to a condition known as "rebound headache."   A pattern of taking too much headache medication includes taking it more than 2 days per week or in excessive amounts. That means more than the label or a caregiver advises. With rebound  headaches, your medications not only stop relieving pain, they actually begin to cause headaches. Doctors treat rebound headache by tapering the medication that is being overused. Sometimes your caregiver will gradually substitute a different type of treatment or medication. Stopping may be a challenge. Regularly overusing a medication increases the potential for serious side effects. Consult a caregiver if you regularly use headache medications more than 2 days per week or more than the label advises. ADDITIONAL QUESTIONS AND ANSWERS Q: What is biofeedback? A: Biofeedback is a self-help treatment. Biofeedback uses special equipment to monitor your body's involuntary physical responses. Biofeedback monitors:  Breathing.  Pulse.  Heart rate.  Temperature.  Muscle tension.  Brain activity. Biofeedback helps you refine and perfect your relaxation exercises. You learn to control the physical responses that are related to stress. Once the technique has been mastered, you do not need the equipment any more. Q: Are headaches hereditary? A: Four out of five (80%) of people that suffer report a family history of migraine. Scientists are not sure if this is genetic or a family predisposition. Despite the uncertainty, a child has a 50% chance of having migraine if one parent suffers. The child has a 75% chance if both parents suffer.  Q: Can children get headaches? A: By the time they reach high school, most young people have experienced some type of headache. Many safe and effective approaches or medications can prevent a headache from occurring or stop it after it has begun.  Q: What type of doctor should I see to diagnose and treat my headache? A: Start with your primary caregiver. Discuss his or her experience and approach to headaches. Discuss methods of classification, diagnosis, and treatment. Your caregiver may decide to recommend you to a headache specialist, depending upon your symptoms or other  physical conditions. Having diabetes, allergies, etc., may require a more comprehensive and inclusive approach to your headache. The National Head[employer] Foundation will provide, upon request, a list of [facility] physician members in your state. Document Released: 08/26/2003 Document Revised: 08/28/2011 Document Reviewed: 02/03/2008 [facility] CenterExitCare Patient Information 2014 [place], [place].  Nosebleed Nosebleeds can be caused by many conditions including trauma, infections, polyps, foreign bodies, dry mucous membranes or climate, medications and air conditioning. Most nosebleeds occur in the front of the nose. It is because of this location that most nosebleeds can be controlled by pinching the nostrils gently and continuously. Do this for at least 10 to 20 minutes. The reason for this long continuous pressure is that you must hold it long enough for the blood to clot. If during that 10 to 20 minute time period, pressure is released, the process may have to be started again. The nosebleed may stop by itself, quit with pressure, need concentrated heating (cautery) or stop with pressure from packing. HOME CARE INSTRUCTIONS   If your nose was packed, try to maintain the pack inside until your caregiver removes it. If a gauze pack was used and it starts to fall out, gently replace or cut the end off. Do not cut if a balloon catheter was used to pack the nose. Otherwise, do not remove unless instructed.  Avoid blowing your nose for 12 hours after treatment. This could dislodge the pack or clot and start bleeding again.  If the bleeding starts again, sit up  and bending forward, gently pinch the front half of your nose continuously for 20 minutes.  If bleeding was caused by dry mucous membranes, cover the inside of your nose every morning with a petroleum or antibiotic ointment. Use your little fingertip as an applicator. Do this as needed during dry weather. This will keep the mucous membranes moist and allow them to  heal.  Maintain humidity in your home by using less air conditioning or using a humidifier.  Do not use aspirin or medications which make bleeding more likely. Your caregiver can give you recommendations on this.  Resume normal activities as able but try to avoid straining, lifting or bending at the waist for several days.  If the nosebleeds become recurrent and the cause is unknown, your caregiver may suggest laboratory tests. SEEK IMMEDIATE MEDICAL CARE IF:   Bleeding recurs and cannot be controlled.  There is unusual bleeding from or bruising on other parts of the body.  You have a fever.  Nosebleeds continue.  There is any worsening of the condition which originally brought you in.  You become lightheaded, feel faint, become sweaty or vomit blood. MAKE SURE YOU:   Understand these instructions.  Will watch your condition.  Will get help right away if you are not doing well or get worse. Document Released: 03/15/2005 Document Revised: 08/28/2011 Document Reviewed: 05/07/2009 [facility] MayExitCare Patient Information 2014 [place], [place].

## 2013-10-10 NOTE — ED Notes (Addendum)
Pt states she began to have HA 5 days ago. Was seen in ED in [place], was discharge. Pt states HA didn't go away so she went back 2 days later, and was discharged again. Pt states last night she had 2 nosebleeds and has begun to throw up. No bleeding at present. Pt states her "head feels like it is about to explode" and hands tingle at times. No hx headaches. Pt states pain is generalized across head. MD at bedside.

## 2013-10-10 NOTE — ED Provider Notes (Signed)
CSN: [id number]     Arrival date & time 10/10/13  [id number] History   First MD Initiated Contact with Patient 10/10/13 0840     Chief Complaint  Patient presents with  . Headache  . Emesis     (Consider location/radiation/quality/duration/timing/severity/associated sxs/prior Treatment) HPI Comments: Patient presents to the ER for evaluation of headache and nosebleeds. Patient reports that she had onset of headache 5 days ago. There was mild initially and has progressively worsened over a period of 5 days. She has been seen in another ER and told to take ibuprofen without improvement. Last night she had 2 separate episodes of bleeding from the left side of her nose. Stopped with pressure, no bleeding currently.  Patient reports that the headache is diffuse, throbbing, moderate to severe. No visual disturbance.  She was seen this week by her doctor for nasal congestion, sinus symptoms. She was started on Augmentin and Astelin nasal spray.  Patient is a 23 y.o. female presenting with headaches and vomiting.  Headache Associated symptoms: congestion and vomiting   Emesis Associated symptoms: headaches     Past Medical History  Diagnosis Date  . Asthma   . Anxiety    Past Surgical History  Procedure Laterality Date  . Skin cancer excision     Family History  Problem Relation Age of Onset  . Diabetes Father   . Hyperlipidemia Father   . Hypertension Father   . Asthma Sister    History  Substance Use Topics  . Smoking status: Never Smoker   . Smokeless tobacco: Never Used  . Alcohol Use: No   OB History   Grav Para Term Preterm Abortions TAB SAB Ect Mult Living                 Review of Systems  HENT: Positive for congestion and nosebleeds.   Gastrointestinal: Positive for vomiting.  Neurological: Positive for headaches.  All other systems reviewed and are negative.     Allergies  Review of patient's allergies indicates no known allergies.  Home Medications    Prior to Admission medications   Medication Sig Start Date End Date Taking? Authorizing Provider  albuterol (PROVENTIL HFA;VENTOLIN HFA) 108 (90 BASE) MCG/ACT inhaler Inhale 2 puffs into the lungs every 6 (six) hours as needed. For shortness of breath.   Yes Historical Provider, MD  amoxicillin-clavulanate (AUGMENTIN) 875-125 MG per tablet Take 1 tablet by mouth 2 (two) times daily. For 15 days 10/09/13  Yes Historical Provider, MD  ARIPiprazole 400 MG SUSR Inject 400 mg into the muscle every 30 (thirty) days.    Yes Historical Provider, MD  aspirin-acetaminophen-caffeine (EXCEDRIN MIGRAINE) [phone] MG per tablet Take 2 tablets by mouth every 6 (six) hours as needed for headache.   Yes Historical Provider, MD  azelastine (ASTELIN) 137 MCG/SPRAY nasal spray Place 2 sprays into both nostrils 2 (two) times daily. Use in each nostril as directed   Yes Historical Provider, MD  valACYclovir (VALTREX) 1000 MG tablet Take 1,000 mg by mouth daily.   Yes Historical Provider, MD   BP 141/78  Pulse 68  Temp(Src) 97.4 F (36.3 C) (Oral)  Resp 16  SpO2 100%  LMP 10/08/2013 Physical Exam  Constitutional: She is oriented to person, place, and time. She appears well-developed and well-nourished. No distress.  HENT:  Head: Normocephalic and atraumatic.  Right Ear: Hearing normal.  Left Ear: Hearing normal.  Nose: Mucosal edema present. No nasal septal hematoma. No epistaxis.    Mouth/Throat: Oropharynx is clear  and moist and mucous membranes are normal.  Eyes: Conjunctivae and EOM are normal. Pupils are equal, round, and reactive to light.  Neck: Normal range of motion. Neck supple.  Cardiovascular: Regular rhythm, S1 normal and S2 normal.  Exam reveals no gallop and no friction rub.   No murmur heard. Pulmonary/Chest: Effort normal and breath sounds normal. No respiratory distress. She exhibits no tenderness.  Abdominal: Soft. Normal appearance and bowel sounds are normal. There is no  hepatosplenomegaly. There is no tenderness. There is no rebound, no guarding, no tenderness at McBurney's point and negative Murphy's sign. No hernia.  Musculoskeletal: Normal range of motion.  Neurological: She is alert and oriented to person, place, and time. She has normal strength. No cranial nerve deficit or sensory deficit. Coordination normal. GCS eye subscore is 4. GCS verbal subscore is 5. GCS motor subscore is 6.  Skin: Skin is warm, dry and intact. No rash noted. No cyanosis.  Psychiatric: She has a normal mood and affect. Her speech is normal and behavior is normal. Thought content normal.    ED Course  Procedures (including critical care time) Labs Review Labs Reviewed - No data to display  Imaging Review Ct Head Wo Contrast  10/10/2013   CLINICAL DATA:  Headache  EXAM: CT HEAD WITHOUT CONTRAST  TECHNIQUE: Contiguous axial images were obtained from the base of the skull through the vertex without intravenous contrast.  COMPARISON:  None.  FINDINGS: Ventricle size is normal. Negative for acute or chronic infarction. Negative for hemorrhage or fluid collection. Negative for mass or edema. No shift of the midline structures.  Calvarium is intact.  IMPRESSION: Normal   Electronically Signed   By: Marlan Palauharles  Clark M.D.   On: 10/10/2013 09:32     EKG Interpretation None      MDM   Final diagnoses:  None   Patient presents to the ER for evaluation of headache and nosebleeds. Headache has been slow in onset, progressively worsening. There are no worrisome features. She has normal neurologic function. A head CT was performed and does not show any intracranial abnormality. I feel that the patient is extremely low-risk for subarachnoid hemorrhage, do not feel that the patient requires lumbar puncture at this time. She was treated with migraine-type medication here in the ER. I agree with the treatment plan from the primary care physician, including Augmentin and Astelin. At this time she  does have dilated vessels on her anterior septum, but no bleeding. It did not feel that she would require cauterization.    Gilda Creasehristopher J. Pollina, MD 10/10/13 [phone]

## 2014-12-09 IMAGING — CT CT ABD-PELV W/ CM
1 of 2 series · 14 of 32 positions shown, 19 images · IV contrast (OMNIPAQUE 300)
Comparison: 11/24/2011

CLINICAL DATA: Flank pain.  Rule out appendicitis.

EXAM:
CT ABDOMEN AND PELVIS WITH CONTRAST
TECHNIQUE: Multidetector CT imaging of the abdomen and pelvis was performed
using the standard protocol following bolus administration of
intravenous contrast.
CONTRAST:  100mL OMNIPAQUE IOHEXOL 300 MG/ML  SOLN

[Series 2: abd/pel with · axial · 0.89mm/px · z∈[-163,+312]mm · 14 of 105 slices shown, 19 images]
[im 5/105  soft-tissue]
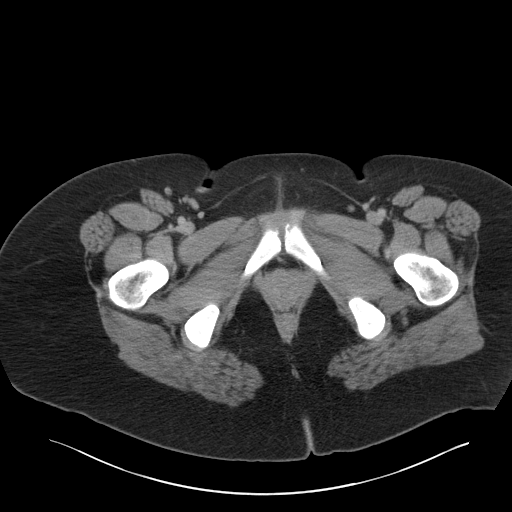
[im 5/105  bone]
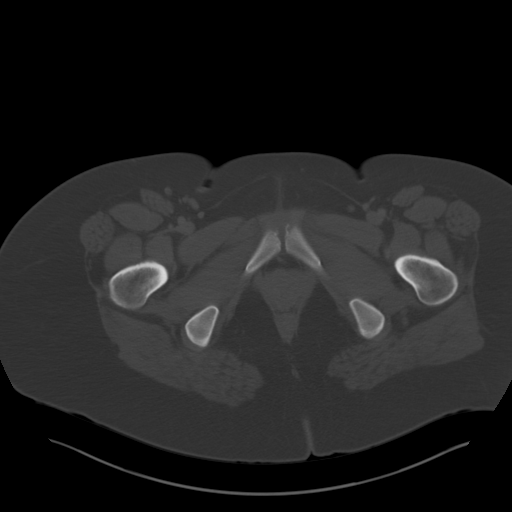
[im 15/105  soft-tissue]
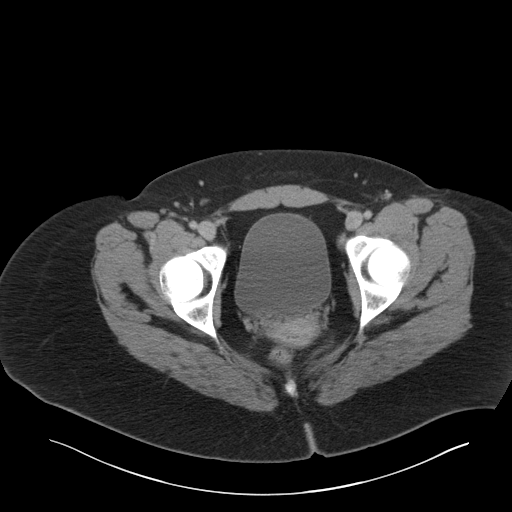
[im 20/105  soft-tissue]
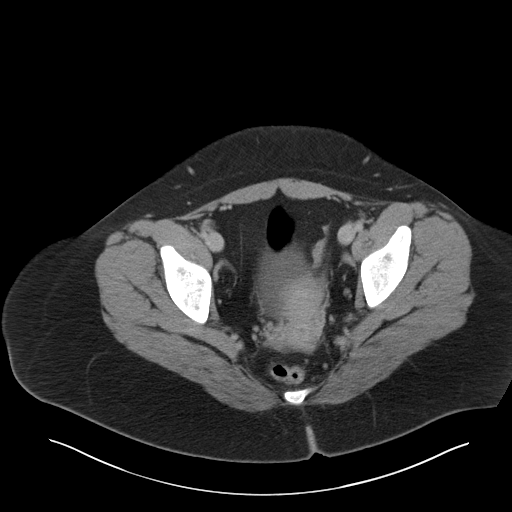
[im 30/105  soft-tissue]
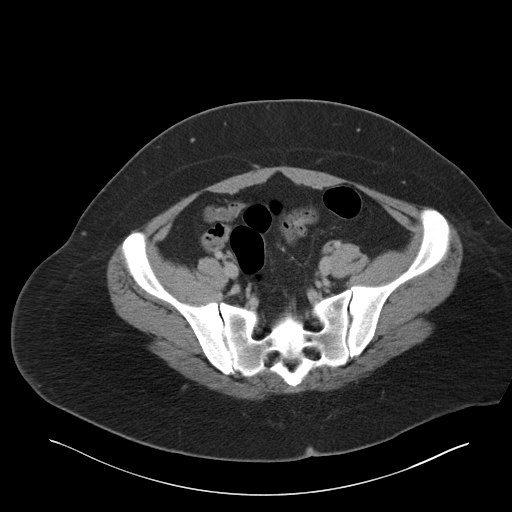
[im 35/105  soft-tissue]
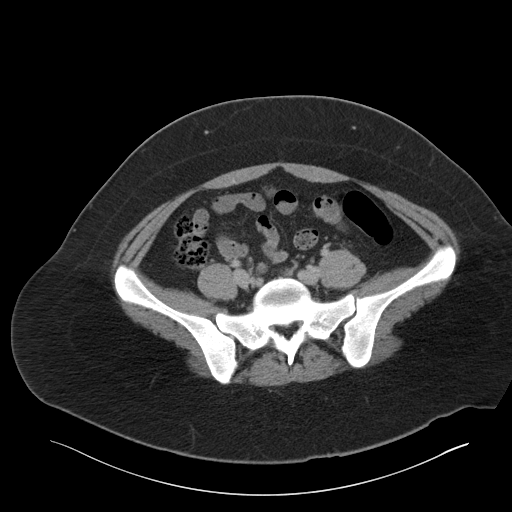
[im 45/105  soft-tissue]
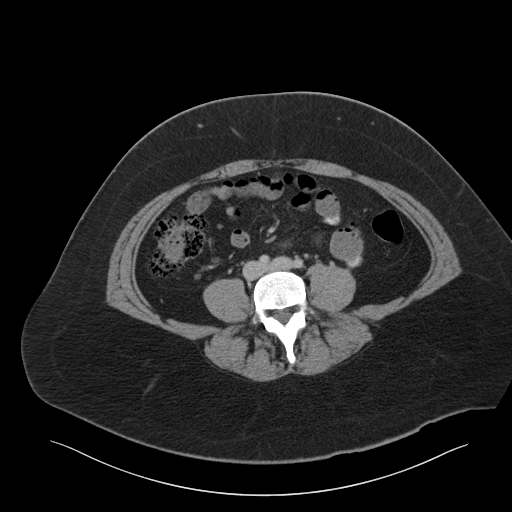
[im 55/105  soft-tissue]
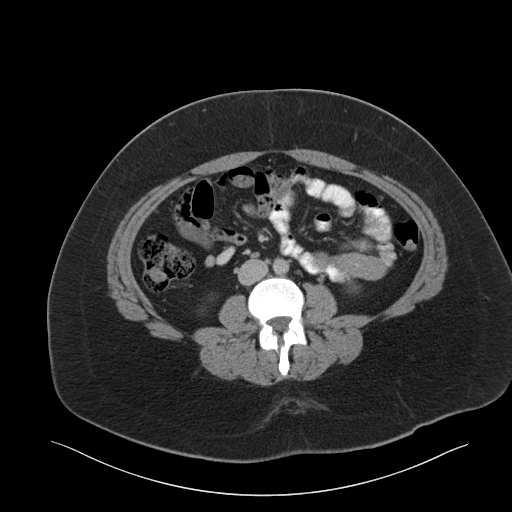
[im 60/105  soft-tissue]
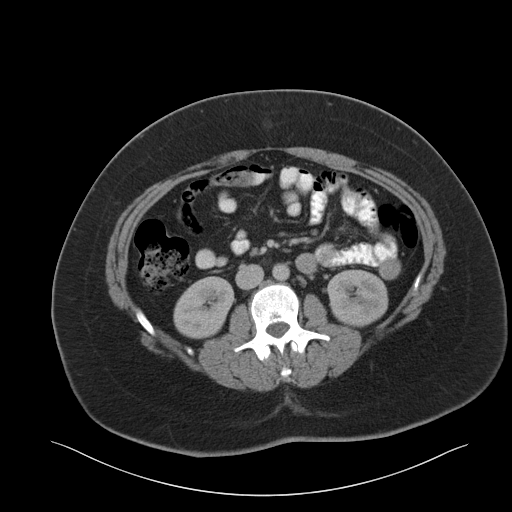
[im 70/105  soft-tissue]
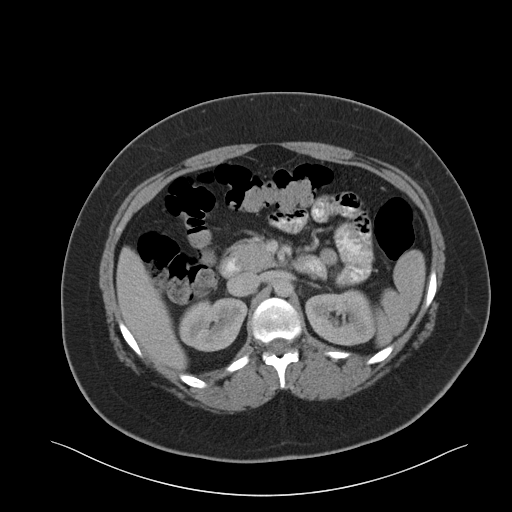
[im 70/105  bone]
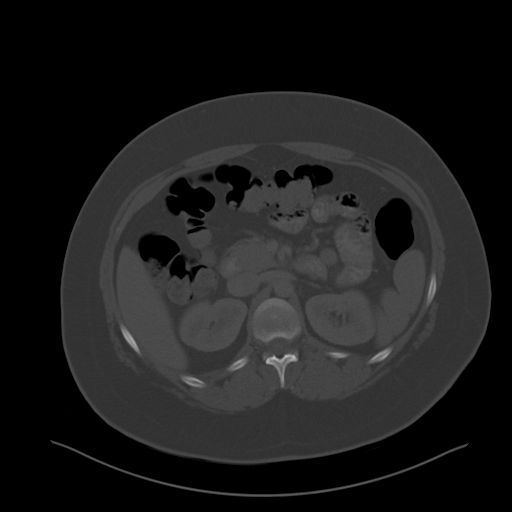
[im 75/105  soft-tissue]
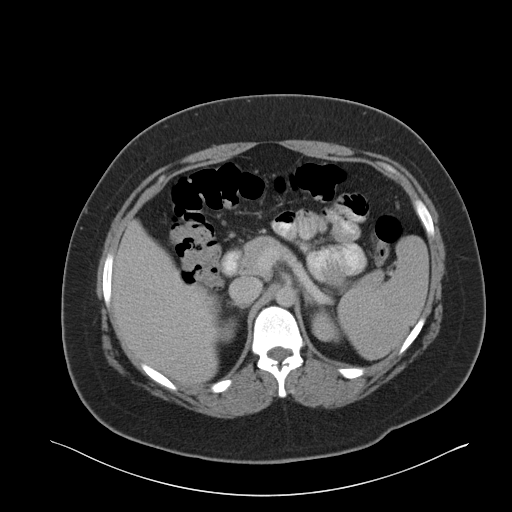
[im 85/105  soft-tissue]
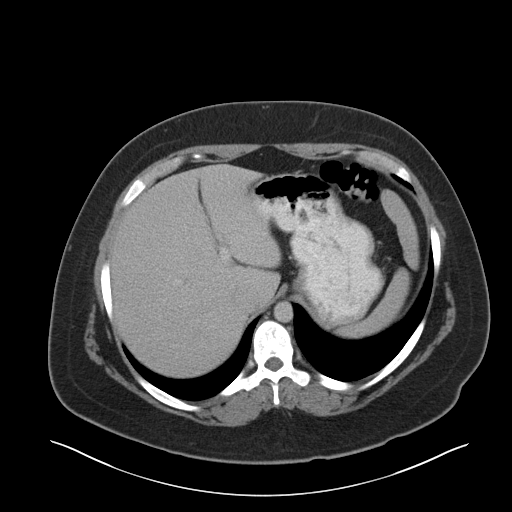
[im 85/105  lung]
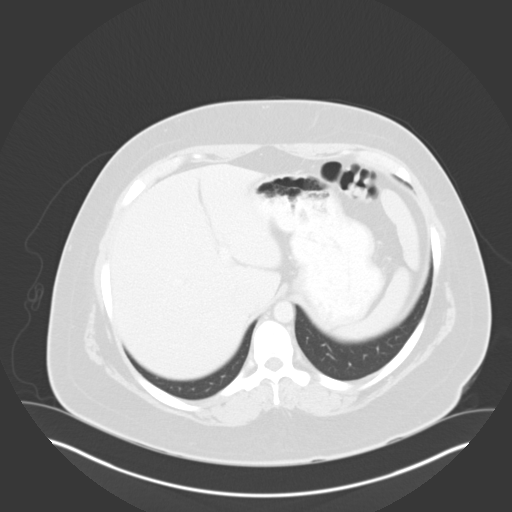
[im 90/105  soft-tissue]
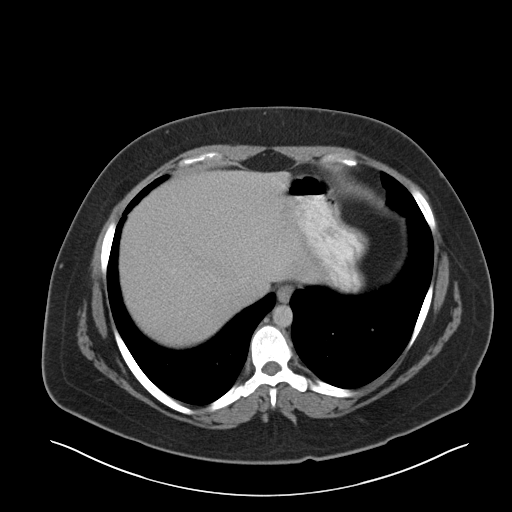
[im 90/105  lung]
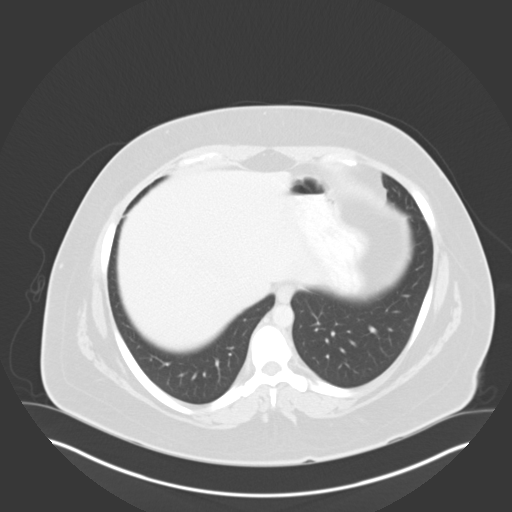
[im 95/105  lung]
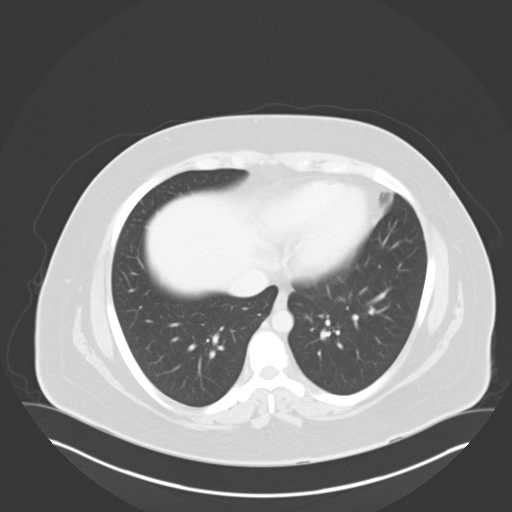
[im 100/105  soft-tissue]
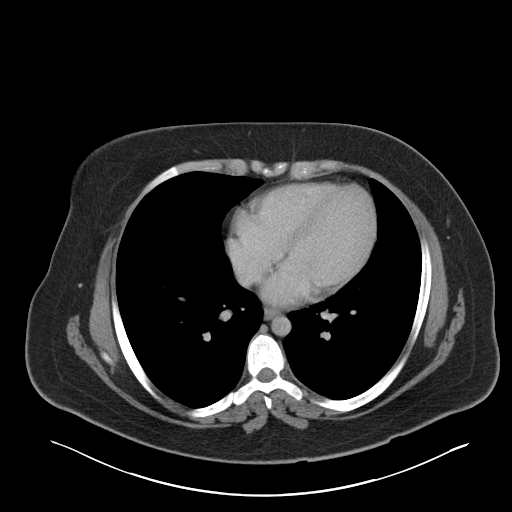
[im 100/105  lung]
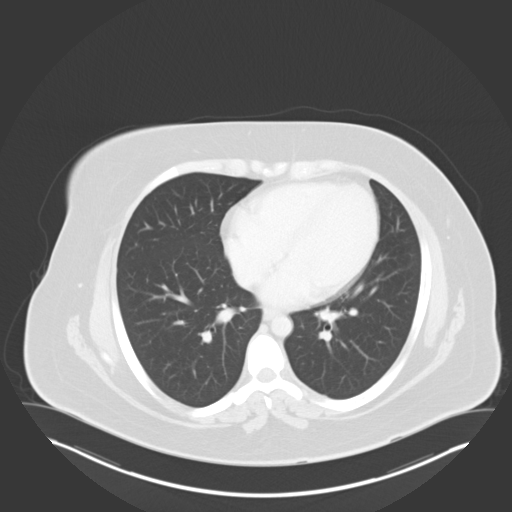

[14 of 32 positions shown; findings below may reference images not displayed]

FINDINGS: BODY WALL: Unremarkable.

LOWER CHEST: Few locules of gas in the right ventricle.

ABDOMEN/PELVIS:

Liver: No focal abnormality.

Biliary: No evidence of biliary obstruction or stone.

Pancreas: Unremarkable.

Spleen: Unremarkable.

Adrenals: Unremarkable.

Kidneys and ureters: No hydronephrosis or stone.

Bladder: Unremarkable.

Reproductive: Unremarkable.

Bowel: No obstruction. Normal appendix.

Retroperitoneum: No mass or adenopathy.

Peritoneum: No free fluid or gas.

Vascular: Mild right common iliac artery atherosclerotic
calcification.

OSSEOUS: Disc herniations at L3-4, L4-5, and L5-S1, which appear
unchanged from 0178 comparison. The largest is right paracentral at
L5-S1, contacting the S1 nerve root within the lateral recess.
IMPRESSION: 1. No acute intra-abdominal abnormality, including appendicitis.
2. Lower lumbar degenerative disc disease, without appreciable
change from 0178.

## 2015-01-31 IMAGING — CT CT HEAD W/O CM
2 series · 16 of 30 positions shown, 20 images · non-contrast
Comparison: None.

CLINICAL DATA: Headache

EXAM:
CT HEAD WITHOUT CONTRAST
TECHNIQUE: Contiguous axial images were obtained from the base of the skull
through the vertex without intravenous contrast.

[Series 2: head w/o · axial · non-contrast · 0.43mm/px · z∈[-31,+84]mm · 13 of 27 slices shown, 17 images]
[im 2/27  brain]
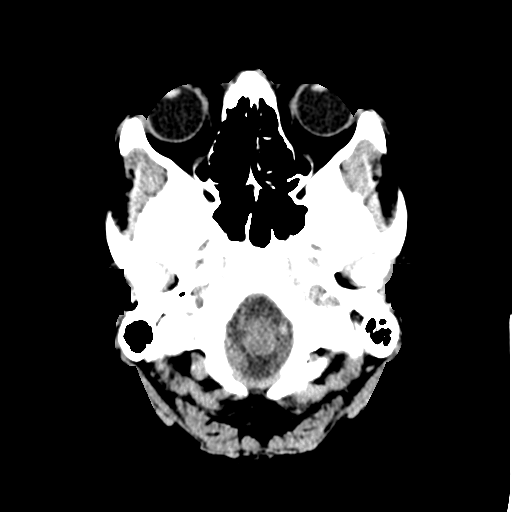
[im 2/27  bone]
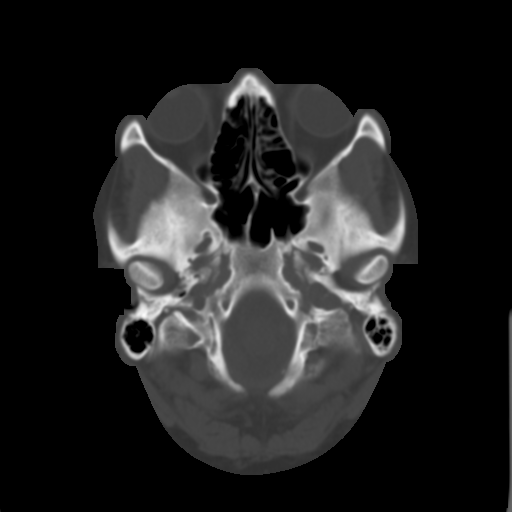
[im 4/27  brain]
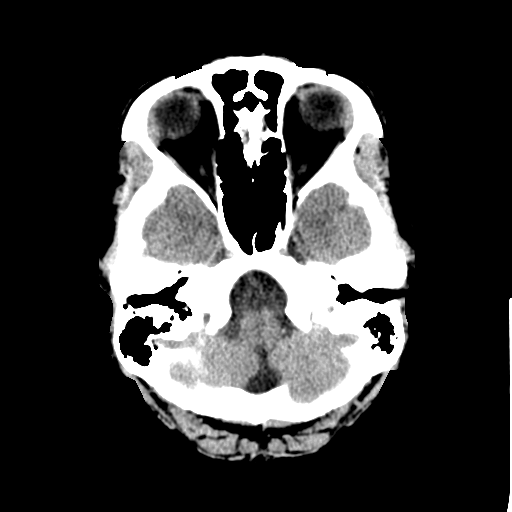
[im 6/27  brain]
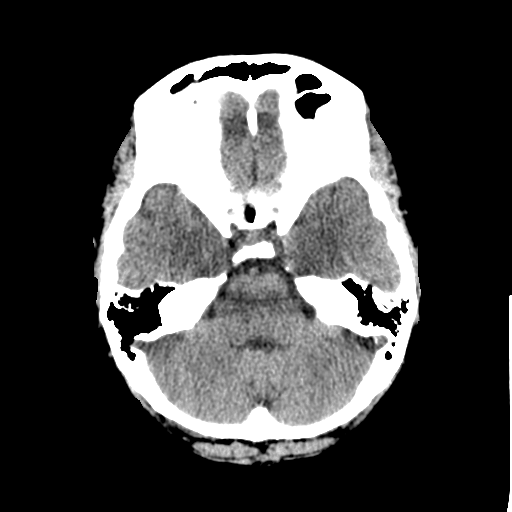
[im 8/27  brain]
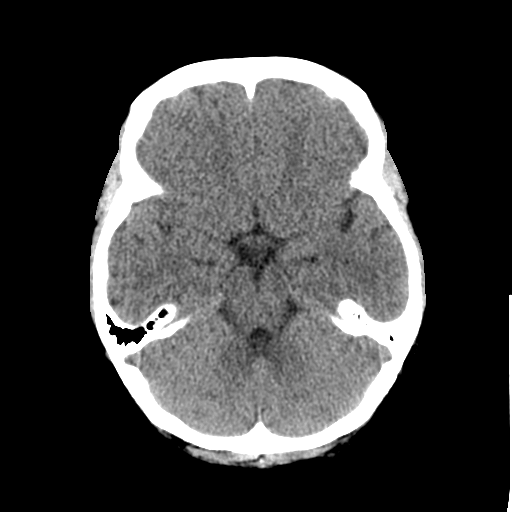
[im 10/27  brain]
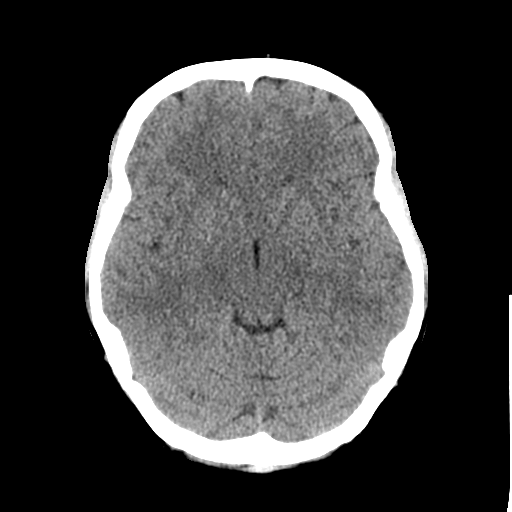
[im 10/27  bone]
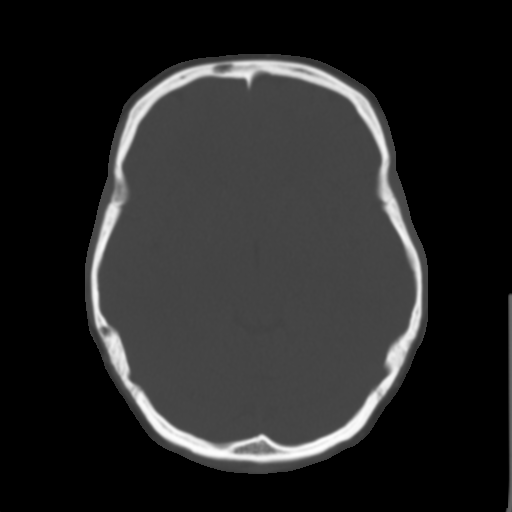
[im 12/27  brain]
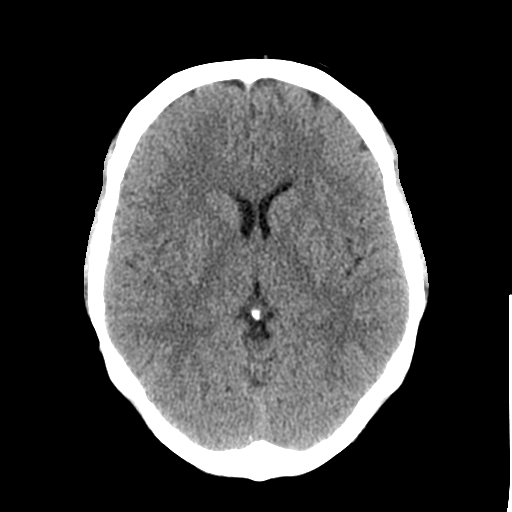
[im 14/27  brain]
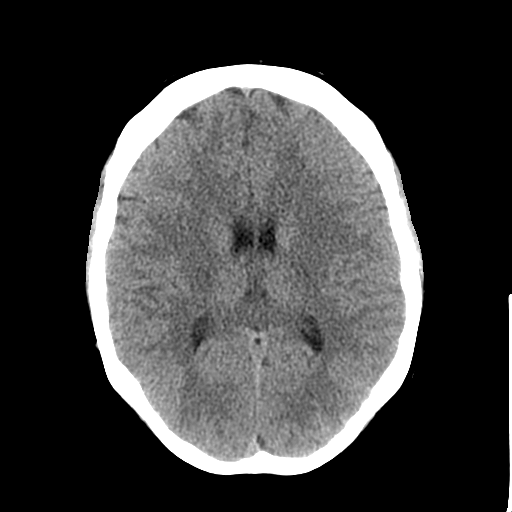
[im 15/27  brain]
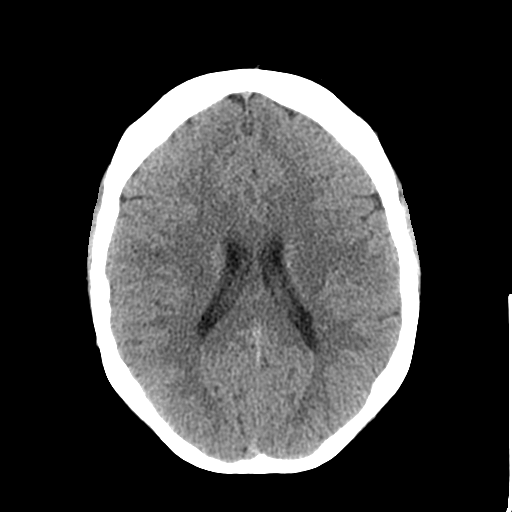
[im 17/27  brain]
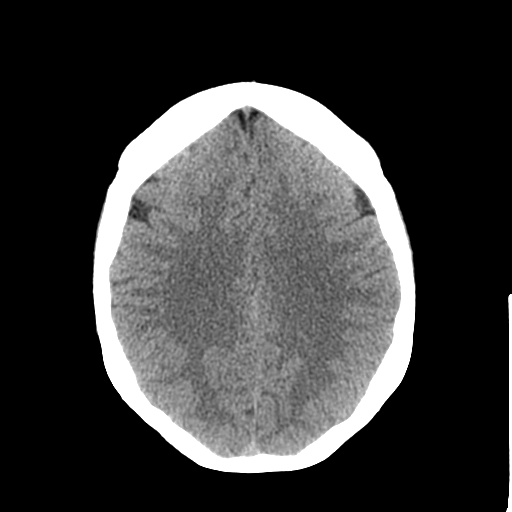
[im 17/27  bone]
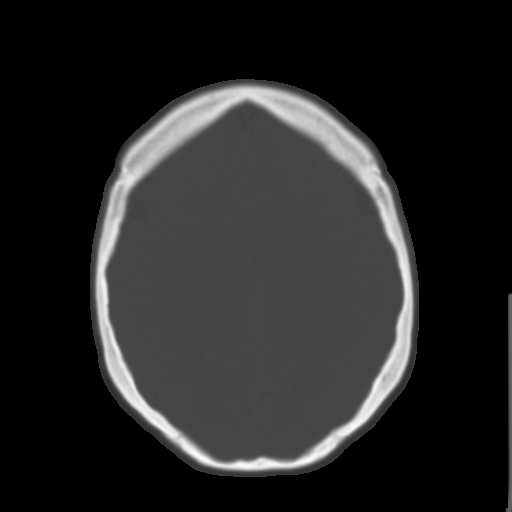
[im 19/27  brain]
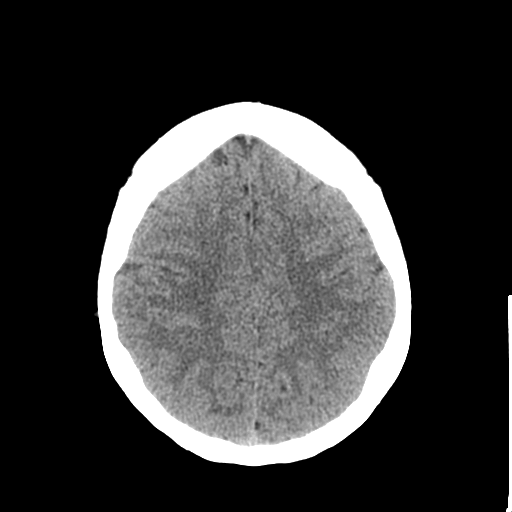
[im 21/27  brain]
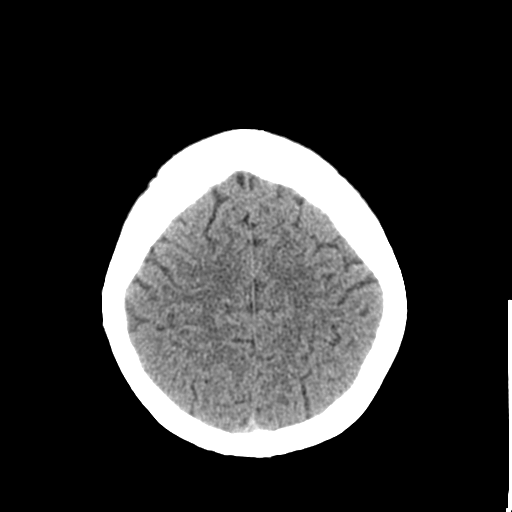
[im 23/27  brain]
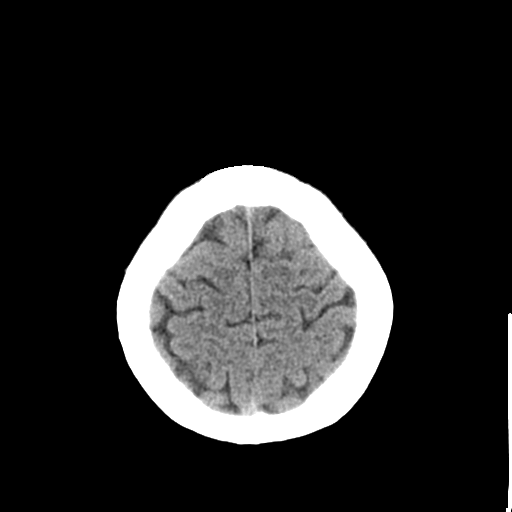
[im 25/27  brain]
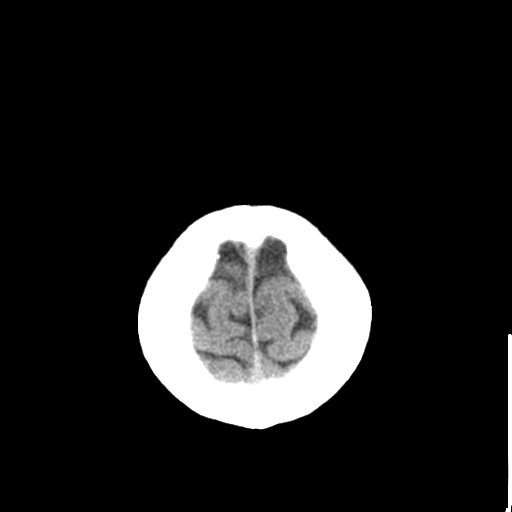
[im 25/27  bone]
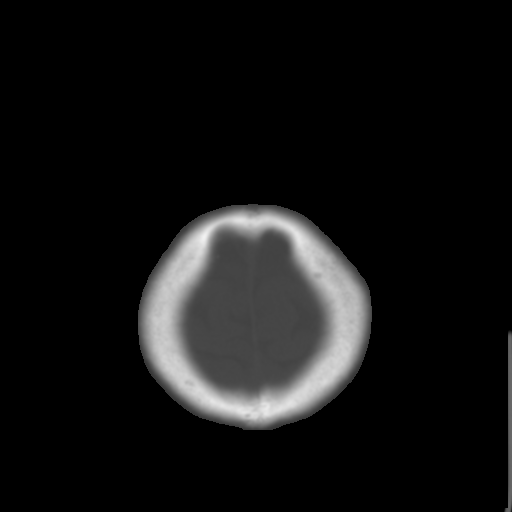

[Series 3: bone windows · axial · 0.43mm/px · z∈[-31,+9]mm · 3 of 27 slices shown]
[im 2/27  bone]
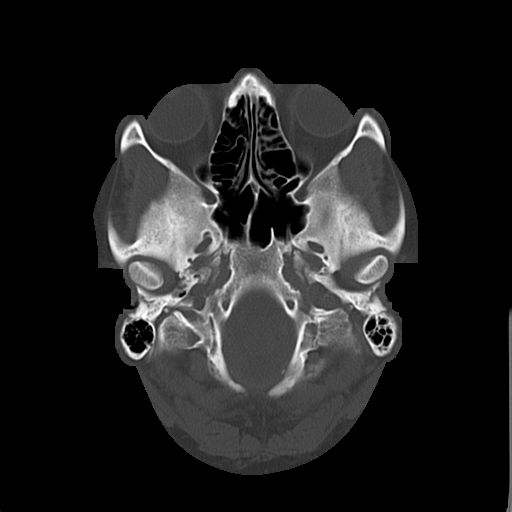
[im 6/27  bone]
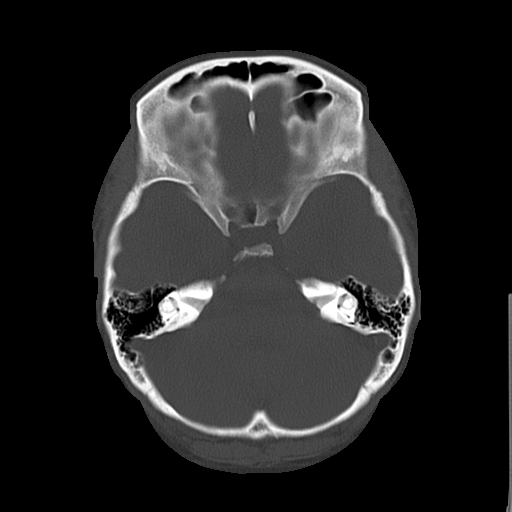
[im 10/27  bone]
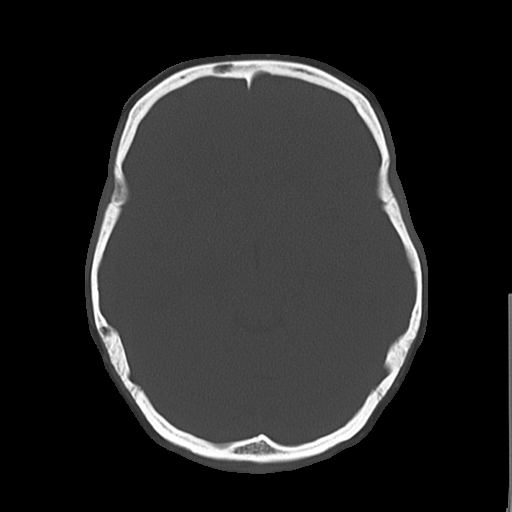

[16 of 30 positions shown; findings below may reference images not displayed]

FINDINGS: Ventricle size is normal. Negative for acute or chronic infarction.
Negative for hemorrhage or fluid collection. Negative for mass or
edema. No shift of the midline structures.

Calvarium is intact.
IMPRESSION: Normal
